# Patient Record
Sex: Female | Born: 1970 | Race: White | Hispanic: No | Marital: Married
Health system: Midwestern US, Academic
[De-identification: ages and names within clinical notes are randomized; demographics above are authoritative.]

---

## 2017-02-10 ENCOUNTER — Ambulatory Visit: Admit: 2017-02-10 | Discharge: 2017-02-11 | Payer: BC Managed Care – HMO

## 2017-02-10 ENCOUNTER — Encounter: Admit: 2017-02-10 | Discharge: 2017-02-10 | Payer: BC Managed Care – HMO

## 2017-02-10 DIAGNOSIS — R079 Chest pain, unspecified: Principal | ICD-10-CM

## 2017-02-19 ENCOUNTER — Ambulatory Visit: Admit: 2017-02-19 | Discharge: 2017-02-20 | Payer: BC Managed Care – HMO

## 2017-02-19 ENCOUNTER — Encounter: Admit: 2017-02-19 | Discharge: 2017-02-19 | Payer: BC Managed Care – HMO

## 2017-02-19 DIAGNOSIS — Z01810 Encounter for preprocedural cardiovascular examination: ICD-10-CM

## 2017-02-19 DIAGNOSIS — R0789 Other chest pain: Principal | ICD-10-CM

## 2019-02-16 IMAGING — CR CHEST
1 series · 1 of 1 positions shown · non-contrast
Comparison: none

[chest port x-wise]
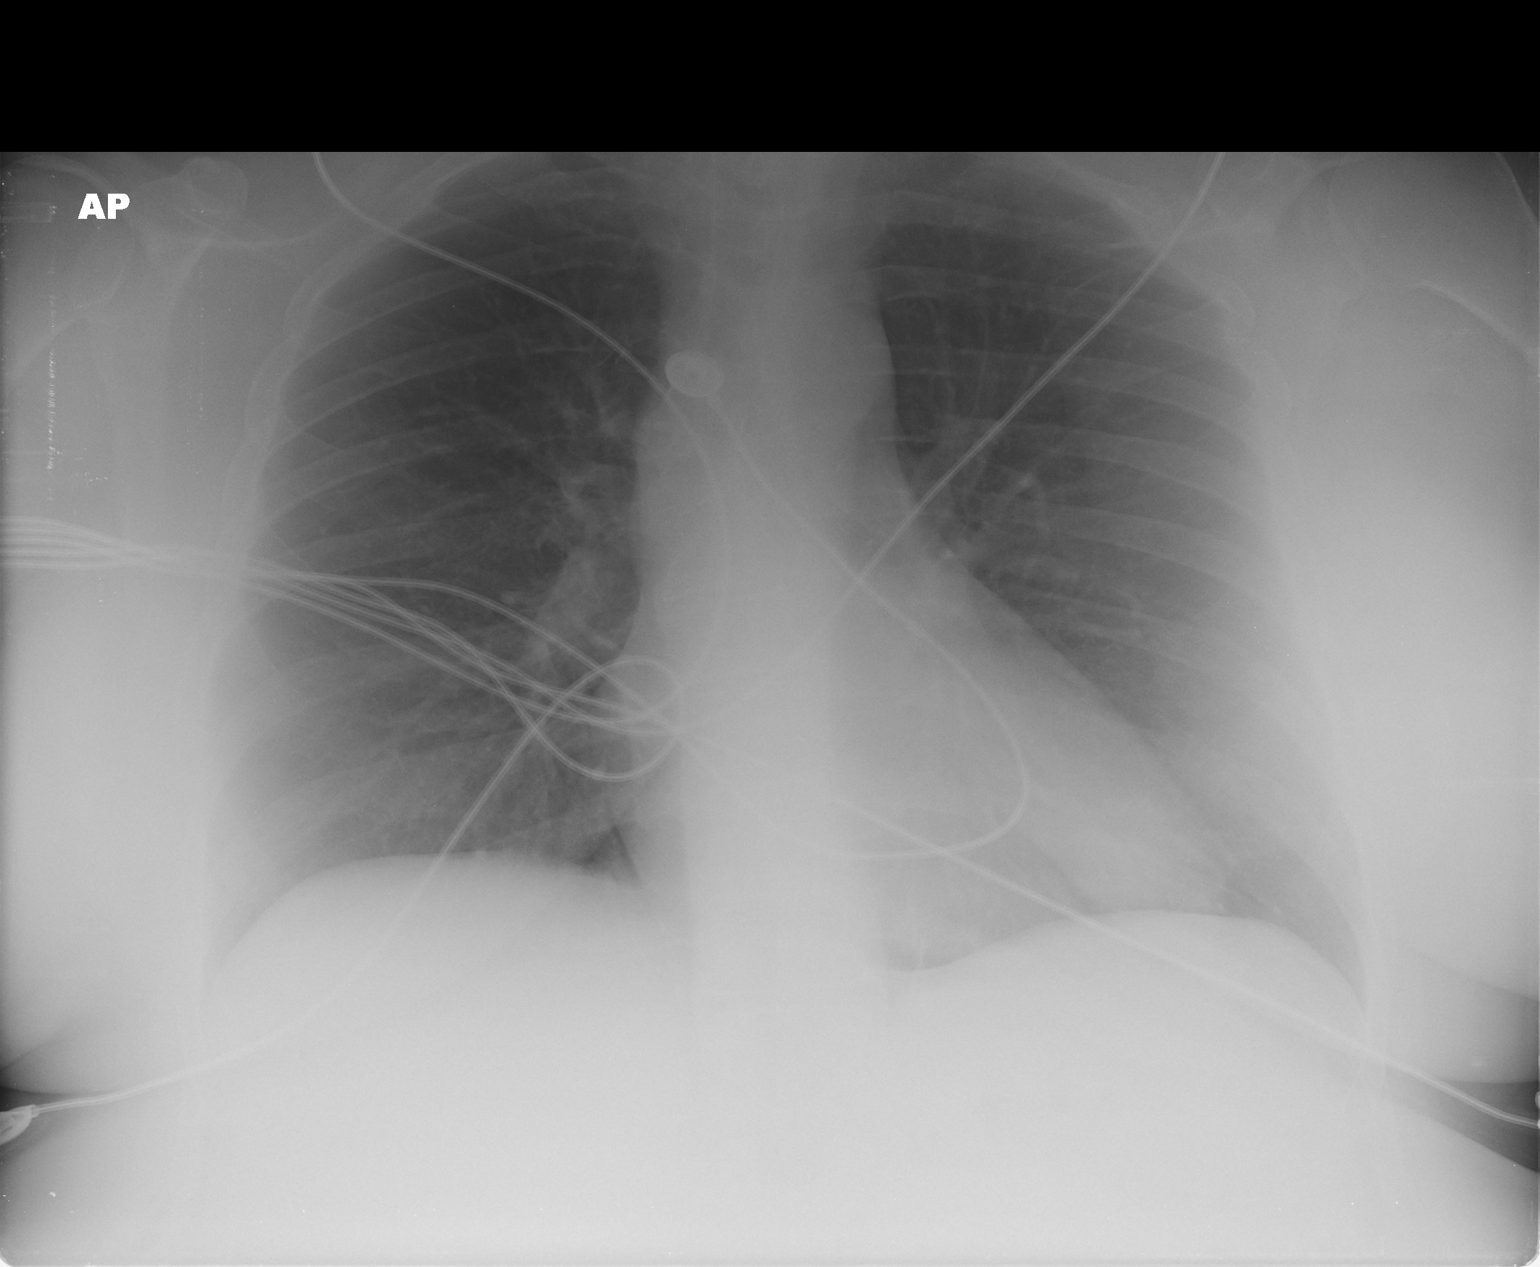

[1 of 1 positions shown; findings below may reference images not displayed]

DIAGNOSTIC STUDIES

EXAM

Chest.

INDICATION

chest pain
PT STATES CHEST PAIN AND DYSPNEA X [id number]. MEDS FOR HYPERTENSION. DENIED
PREGNANCY. SB

TECHNIQUE

Frontal chest.

COMPARISONS

None.

FINDINGS

The lungs are clear without consolidative airspace disease, pleural effusion, or pneumothorax. The
cardiomediastinal silhouette is normal.

IMPRESSION

No acute cardiopulmonary pathology.

## 2022-01-29 ENCOUNTER — Encounter: Admit: 2022-01-29 | Discharge: 2022-01-29 | Payer: BC Managed Care – PPO

## 2022-01-30 ENCOUNTER — Encounter: Admit: 2022-01-30 | Discharge: 2022-01-30 | Payer: BC Managed Care – PPO

## 2022-01-30 NOTE — Telephone Encounter
Pt says she will bring imaging reports with her. Pt si not active in mychart she will come in 20 min early to complete them here in the office.

## 2022-02-14 ENCOUNTER — Encounter: Admit: 2022-02-14 | Discharge: 2022-02-14 | Payer: BC Managed Care – PPO

## 2022-02-14 ENCOUNTER — Ambulatory Visit: Admit: 2022-02-14 | Discharge: 2022-02-14 | Payer: BC Managed Care – PPO

## 2022-02-14 DIAGNOSIS — G589 Mononeuropathy, unspecified: Secondary | ICD-10-CM

## 2022-02-14 DIAGNOSIS — G5781 Other specified mononeuropathies of right lower limb: Secondary | ICD-10-CM

## 2022-02-14 DIAGNOSIS — M62838 Other muscle spasm: Secondary | ICD-10-CM

## 2022-02-14 DIAGNOSIS — M79661 Pain in right lower leg: Secondary | ICD-10-CM

## 2022-02-14 DIAGNOSIS — M792 Neuralgia and neuritis, unspecified: Secondary | ICD-10-CM

## 2022-02-14 DIAGNOSIS — M5431 Sciatica, right side: Secondary | ICD-10-CM

## 2022-02-14 MED ORDER — TIZANIDINE 2 MG PO TAB
2-4 mg | ORAL_TABLET | Freq: Every evening | ORAL | 3 refills | Status: AC | PRN
Start: 2022-02-14 — End: ?

## 2022-02-14 NOTE — Progress Notes
Dear Dr. Ezra Sites,    I appreciate your kind referral of Carla Weber for evaluation of pain. Please see my note below for the full details of the evaluation and management plan.    Thank you,    Evelina Bucy, MD        [facility]  NEW PATIENT HISTORY AND PHYSICAL  Subjective     Chief Complaint: Pain  Chief Complaint   Patient presents with   ? Right Leg - Pain     Right shin   ? New Patient       HPI: Carla Weber is a 51 y.o. female who  has no past medical history on file. who presents for evaluation.    The pain is in the right shin.     No weakness.   No radicular pain.    It is in the mid-shin.   No allodynia.   Mild tenderness in the area.     The pain is deep.     Pain started: Greater than 1 year  2019    Initial inciting injury or event: Started after her 2nd right knee replacement surgery to fix the first one.     She states there is cement in the area of pain based on a recent scan.     Numbness/tingling: None      The pain averages Leg or foot  4-10/10    The pain is described as Aching, Stabbing, Shooting, Burning, Penetrating, Throbbing, Sharp, Unbearable      The pain is exacerbated by Sit, Stand, Walk        The pain is partially alleviated by Other (comment) Nothing has helped         PRIOR MEDICATIONS:   Effective      Ineffective  Gabapentin  Lyrica  NSAID  Acetaminophen    Unable to tolerate      Never  Ami/Nortriptyline  Cymbalta  Tizanidine      PRIOR INTERVENTIONS:  No spine surgery  Effective      Ineffective  Physician-ordered PT        Carla Weber denies any recent fevers, chills, infection, antibiotics, bowel or bladder incontinence, saddle anesthesia, bleeding issues, or recent anticoagulant.     ROS: All 14 systems reviewed and found to be negative except as above and as follows. +fatigue, poor sleep.     Past Medical History:  No past medical history on file.    Family History:  No family history on file.    Social History:  Lives in [place] [id number] (1.5 hours away)  Works as site Office manager.   Social History     Socioeconomic History   ? Marital status: Married       Allergies:  Not on File    Medications:  No current outpatient medications on file.  She states she takes an antidepressant medication.     Physical examination:   BP (!) 154/103 (BP Source: Arm, Right Upper, Patient Position: Sitting)  - Pulse 68  - Ht 168.9 cm (5' 6.5)  - Wt 117 kg (258 lb)  - LMP  (LMP Unknown)  - SpO2 98%  - BMI 41.02 kg/m?   Pain Score: Four  Oswestry Total Score:: 22    General: The patient is a well-developed, morbidly obese 51 y.o. female in no acute distress.   HEENT: Head is normocephalic and atraumatic. EOMI bilaterally.   Cardiac: Based on palpation, pulse appears  to be regular rate and rhythm.   Pulmonary: The patient has unlabored respirations and bilateral symmetric chest excursion.   Abdomen: Soft, nontender, and obese. There is no rebound or guarding.   Extremities: No clubbing, cyanosis, or edema. There is no TTP at the anterior shin or knee on the right. Normal ROM. No instability noted. No allodynia. No edema, erythema, discoloration, or warmth.     Neurologic:   The patient is alert and oriented times 3.   Cranial nerves II through XII are intact without any focal deficits.     Musculoskeletal:   Gait is mildly antalgic.    L-Spine   There is no paraspinal tenderness. Paraspinal muscle tone is normal.  Facet loading is negative.  There is no tenderness or radiating pain with palpation over the SI joints, piriformis, or greater trochanteric bursae bilaterally.  ROM with flexion, extension, rotation, and lateral bending is intact.  Strength is equal and adequate bilaterally in the flexors and extensors of the bilateral lower extremities.   SLR is negative bilaterally.      Bone Scan Results:  01/2022    Relatively small amount of uptake at the tip of the tibial arthroplastic component makes osteoblastic lesion highly unlikely. Findings likely represent normal postoperative radiotracer uptake along arthroplasty.      CT Tib/Fib Right  01/02/22    No fracture is identified. The arthroplasty components appear to be in anatomic position.     There is a sclerotic or blastic area of density within the medullary portion of the proximal right tibia adjacent to the proximal portion of the stem of the tibial component, just below where it transitions from the wider intramedullary portion to the narrow were intramedullary component. This area of abnormally increased bone density measures 2.7 x 2.5 x 1.2 cm in AP, transverse and craniocaudal diameter. The 8 and transverse measurements include the intramedullary component of the prosthesis.     On the prior radiograph from 11/10/2017, there was dense methylmethacrylate cement in this region. However this appears larger on today's study than it was on the prior exam.     No cortical bone destruction is seen. No fracture is identified.      XR Right Tib/Fib  2023    No fracture or acute appearing abnormality of the right tibia or fibula is identified.          Last Cr and LFT's:  No results found for: CR, AST, ALT, ALKPHOS, TOTBILI       Assessment:    Carla Weber is a 51 y.o. female who  has no past medical history on file. who presents for evaluation of pain.    The pain complaints are most likely due to:    1. Pain in right shin        2. Mononeuropathy        3. Neuropathic pain        4. Neuralgia of right saphenous nerve        5. Sciatica of right side        6. Muscle spasm            Patient has had an adequate trial of > 3 months of rest, exercise, multimodal treatment, and the passage of time without improvement of symptoms. The pain has significant impact on the daily quality of life.     Plan:    1. Plan for Right sciatic and saphenous nerve injection with U/S in CLINIC at first available appointment.  2. Will trial tizanidine 2-4mg  qhs prn.   3. Could potentially consider SPRINT 60-day PNS for treatment of this pain.   4. Follow up as needed.     Risks/benefits of all pharmacologic and interventional treatments discussed and questions answered.     Thank you for this kind referral for consultation. Please feel free to contact me with any questions or concerns.

## 2022-02-21 ENCOUNTER — Encounter: Admit: 2022-02-21 | Discharge: 2022-02-21 | Payer: BC Managed Care – PPO

## 2022-02-27 ENCOUNTER — Encounter: Admit: 2022-02-27 | Discharge: 2022-02-27 | Payer: BC Managed Care – PPO

## 2022-02-28 ENCOUNTER — Encounter: Admit: 2022-02-28 | Discharge: 2022-02-28 | Payer: BC Managed Care – PPO

## 2022-02-28 ENCOUNTER — Ambulatory Visit: Admit: 2022-02-28 | Discharge: 2022-02-28 | Payer: BC Managed Care – PPO

## 2022-02-28 DIAGNOSIS — G589 Mononeuropathy, unspecified: Secondary | ICD-10-CM

## 2022-02-28 DIAGNOSIS — I10 Essential (primary) hypertension: Secondary | ICD-10-CM

## 2022-02-28 DIAGNOSIS — K5732 Diverticulitis of large intestine without perforation or abscess without bleeding: Secondary | ICD-10-CM

## 2022-02-28 DIAGNOSIS — M5431 Sciatica, right side: Secondary | ICD-10-CM

## 2022-02-28 DIAGNOSIS — M792 Neuralgia and neuritis, unspecified: Secondary | ICD-10-CM

## 2022-02-28 DIAGNOSIS — F419 Anxiety disorder, unspecified: Secondary | ICD-10-CM

## 2022-02-28 DIAGNOSIS — M79661 Pain in right lower leg: Secondary | ICD-10-CM

## 2022-02-28 DIAGNOSIS — G5781 Other specified mononeuropathies of right lower limb: Secondary | ICD-10-CM

## 2022-02-28 DIAGNOSIS — F32A Depression: Secondary | ICD-10-CM

## 2022-03-11 ENCOUNTER — Encounter: Admit: 2022-03-11 | Discharge: 2022-03-11 | Payer: BC Managed Care – PPO

## 2022-03-13 ENCOUNTER — Encounter: Admit: 2022-03-13 | Discharge: 2022-03-13 | Payer: BC Managed Care – PPO

## 2022-04-01 ENCOUNTER — Encounter: Admit: 2022-04-01 | Discharge: 2022-04-01 | Payer: BC Managed Care – PPO

## 2022-04-01 DIAGNOSIS — F4542 Pain disorder with related psychological factors: Secondary | ICD-10-CM

## 2022-04-01 DIAGNOSIS — G5781 Other specified mononeuropathies of right lower limb: Secondary | ICD-10-CM

## 2022-05-06 ENCOUNTER — Encounter: Admit: 2022-05-06 | Discharge: 2022-05-06 | Payer: BC Managed Care – PPO

## 2022-05-06 ENCOUNTER — Ambulatory Visit: Admit: 2022-05-06 | Discharge: 2022-05-07 | Payer: BC Managed Care – PPO

## 2022-05-06 DIAGNOSIS — Z7189 Other specified counseling: Secondary | ICD-10-CM

## 2022-05-06 DIAGNOSIS — F419 Anxiety disorder, unspecified: Secondary | ICD-10-CM

## 2022-05-06 DIAGNOSIS — G5781 Other specified mononeuropathies of right lower limb: Secondary | ICD-10-CM

## 2022-05-06 DIAGNOSIS — M792 Neuralgia and neuritis, unspecified: Secondary | ICD-10-CM

## 2022-05-06 DIAGNOSIS — F4310 Post-traumatic stress disorder, unspecified: Secondary | ICD-10-CM

## 2022-05-06 DIAGNOSIS — M5431 Sciatica, right side: Secondary | ICD-10-CM

## 2022-05-06 DIAGNOSIS — F3341 Major depressive disorder, recurrent, in partial remission: Secondary | ICD-10-CM
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
## 2022-05-10 ENCOUNTER — Encounter: Admit: 2022-05-10 | Discharge: 2022-05-10 | Payer: BC Managed Care – PPO

## 2022-05-22 ENCOUNTER — Encounter: Admit: 2022-05-22 | Discharge: 2022-05-22 | Payer: BC Managed Care – PPO

## 2022-06-11 ENCOUNTER — Encounter: Admit: 2022-06-11 | Discharge: 2022-06-11 | Payer: BC Managed Care – PPO

## 2022-06-11 NOTE — Telephone Encounter
Message received from Victor:   Carla Weber is unable to pay the amount due. She doesn't want to pay for something that may not work.     Checked with implant team, approval for Sprint device is not location dependent so pt could r/s to [facility]. Reviewed options. Can r/s to [facility] or pt can wait to r/s after giving it some time. If pt r/s, recommend doing it within the next 6 months.     Pt concerned that if she has PNS it may not work. Her nerve block lasted about a week. Review that it helps many people but cannot guarantee that it will help 100% of people. Pt states understanding.     Pt states she is working two jobs right now. Will hold off on PNS. Appts canceled.

## 2022-06-18 ENCOUNTER — Encounter: Admit: 2022-06-18 | Discharge: 2022-06-18 | Payer: BC Managed Care – PPO

## 2022-06-26 ENCOUNTER — Encounter: Admit: 2022-06-26 | Discharge: 2022-06-26 | Payer: BC Managed Care – PPO

## 2022-10-24 ENCOUNTER — Encounter: Admit: 2022-10-24 | Discharge: 2022-10-24 | Payer: BC Managed Care – PPO

## 2022-10-24 DIAGNOSIS — T300 Burn of unspecified body region, unspecified degree: Secondary | ICD-10-CM
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
## 2022-10-28 ENCOUNTER — Ambulatory Visit: Admit: 2022-10-28 | Discharge: 2022-10-29 | Payer: BC Managed Care – PPO

## 2022-10-28 ENCOUNTER — Encounter: Admit: 2022-10-28 | Discharge: 2022-10-28 | Payer: BC Managed Care – PPO

## 2022-10-28 DIAGNOSIS — Z9289 Personal history of other medical treatment: Secondary | ICD-10-CM

## 2022-10-28 DIAGNOSIS — F32A Depression: Secondary | ICD-10-CM

## 2022-10-28 DIAGNOSIS — T7840XA Allergy, unspecified, initial encounter: Secondary | ICD-10-CM

## 2022-10-28 DIAGNOSIS — I10 Essential (primary) hypertension: Secondary | ICD-10-CM

## 2022-10-28 DIAGNOSIS — F419 Anxiety disorder, unspecified: Secondary | ICD-10-CM

## 2022-10-28 DIAGNOSIS — T300 Burn of unspecified body region, unspecified degree: Secondary | ICD-10-CM

## 2022-10-28 DIAGNOSIS — K5732 Diverticulitis of large intestine without perforation or abscess without bleeding: Secondary | ICD-10-CM

## 2022-10-28 MED ORDER — BACITRACIN ZINC 500 UNIT/GRAM TP OINT
Freq: Once | TOPICAL | 0 refills | Status: CP
Start: 2022-10-28 — End: ?
  Administered 2022-10-28: 21:00:00 via TOPICAL
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
## 2022-10-28 NOTE — Patient Instructions
Once a day or every other day, take off all the dressings and get in the shower. If anything is stuck, let the water run over it until it loosens. Use soap, water, and a fresh washcloth to clean everything, rubbing a little to get off loose scabs, etc. If you can wipe it off with a washcloth, your body wasn't planning on keeping it. It's OK if you see a little bleeding. Pat dry    Bacitracin to open areas, cover with yellow gauze and dry dressing, secure with compression. May also use Bandaid if an area is small enough.     Once its healed, lotion to healed area several times a day, wear compression to keep scar flat. Wear sunscreen every day on any exposed areas.  Marland Kitchen

## 2022-10-29 DIAGNOSIS — T300 Burn of unspecified body region, unspecified degree: Secondary | ICD-10-CM

## 2022-11-04 ENCOUNTER — Encounter: Admit: 2022-11-04 | Discharge: 2022-11-04 | Payer: BC Managed Care – PPO

## 2022-11-04 MED ORDER — TIZANIDINE 2 MG PO TAB
2-4 mg | ORAL_TABLET | Freq: Every evening | ORAL | 3 refills | Status: AC | PRN
Start: 2022-11-04 — End: ?

## 2023-05-23 ENCOUNTER — Encounter: Admit: 2023-05-23 | Discharge: 2023-05-23 | Payer: BC Managed Care – PPO
